# Patient Record
Sex: Female | Born: 1970 | Hispanic: No | Smoking: Light tobacco smoker
Health system: Southern US, Community
[De-identification: ages and names within clinical notes are randomized; demographics above are authoritative.]

## PROBLEM LIST (undated history)

## (undated) DIAGNOSIS — C801 Malignant (primary) neoplasm, unspecified: Secondary | ICD-10-CM

## (undated) DIAGNOSIS — D649 Anemia, unspecified: Secondary | ICD-10-CM

## (undated) HISTORY — DX: Anemia, unspecified: D64.9

## (undated) HISTORY — PX: APPENDECTOMY: SHX54

## (undated) HISTORY — PX: CHOLECYSTECTOMY: SHX55

## (undated) HISTORY — PX: ABDOMINAL HYSTERECTOMY: SHX81

## (undated) HISTORY — DX: Malignant (primary) neoplasm, unspecified: C80.1

---

## 2006-02-25 ENCOUNTER — Other Ambulatory Visit: Admission: RE | Admit: 2006-02-25 | Discharge: 2006-02-25 | Payer: Self-pay | Admitting: Family Medicine

## 2008-09-28 ENCOUNTER — Other Ambulatory Visit: Admission: RE | Admit: 2008-09-28 | Discharge: 2008-09-28 | Payer: Self-pay | Admitting: Family Medicine

## 2015-08-11 ENCOUNTER — Ambulatory Visit (INDEPENDENT_AMBULATORY_CARE_PROVIDER_SITE_OTHER): Payer: 59

## 2015-08-11 ENCOUNTER — Other Ambulatory Visit: Payer: Self-pay | Admitting: Family Medicine

## 2015-08-11 DIAGNOSIS — M7989 Other specified soft tissue disorders: Secondary | ICD-10-CM

## 2015-08-11 DIAGNOSIS — M25552 Pain in left hip: Secondary | ICD-10-CM | POA: Diagnosis not present

## 2015-08-11 MED ORDER — IOPAMIDOL (ISOVUE-300) INJECTION 61%: 100.0000 mL | Freq: Once | INTRAVENOUS | Status: AC | PRN

## 2015-12-11 DIAGNOSIS — Z124 Encounter for screening for malignant neoplasm of cervix: Secondary | ICD-10-CM | POA: Diagnosis not present

## 2015-12-11 DIAGNOSIS — Z13220 Encounter for screening for lipoid disorders: Secondary | ICD-10-CM | POA: Diagnosis not present

## 2015-12-11 DIAGNOSIS — Z0000 Encounter for general adult medical examination without abnormal findings: Secondary | ICD-10-CM | POA: Diagnosis not present

## 2016-01-01 DIAGNOSIS — M79604 Pain in right leg: Secondary | ICD-10-CM | POA: Diagnosis not present

## 2016-01-03 ENCOUNTER — Other Ambulatory Visit: Payer: Self-pay | Admitting: Family Medicine

## 2016-01-03 DIAGNOSIS — M79604 Pain in right leg: Secondary | ICD-10-CM

## 2016-01-03 DIAGNOSIS — M79605 Pain in left leg: Principal | ICD-10-CM

## 2016-01-10 ENCOUNTER — Ambulatory Visit: Payer: BLUE CROSS/BLUE SHIELD

## 2016-01-10 DIAGNOSIS — M79604 Pain in right leg: Secondary | ICD-10-CM

## 2016-01-10 DIAGNOSIS — M79605 Pain in left leg: Principal | ICD-10-CM

## 2016-01-10 DIAGNOSIS — M7989 Other specified soft tissue disorders: Secondary | ICD-10-CM | POA: Diagnosis not present

## 2016-01-24 DIAGNOSIS — I872 Venous insufficiency (chronic) (peripheral): Secondary | ICD-10-CM | POA: Diagnosis not present

## 2016-01-30 DIAGNOSIS — R300 Dysuria: Secondary | ICD-10-CM | POA: Diagnosis not present

## 2016-03-14 DIAGNOSIS — Z1231 Encounter for screening mammogram for malignant neoplasm of breast: Secondary | ICD-10-CM | POA: Diagnosis not present

## 2016-07-19 DIAGNOSIS — F419 Anxiety disorder, unspecified: Secondary | ICD-10-CM | POA: Diagnosis not present

## 2016-07-19 DIAGNOSIS — G44209 Tension-type headache, unspecified, not intractable: Secondary | ICD-10-CM | POA: Diagnosis not present

## 2017-01-14 DIAGNOSIS — Z1329 Encounter for screening for other suspected endocrine disorder: Secondary | ICD-10-CM | POA: Diagnosis not present

## 2017-01-14 DIAGNOSIS — Z136 Encounter for screening for cardiovascular disorders: Secondary | ICD-10-CM | POA: Diagnosis not present

## 2017-01-14 DIAGNOSIS — E559 Vitamin D deficiency, unspecified: Secondary | ICD-10-CM | POA: Diagnosis not present

## 2017-01-14 DIAGNOSIS — Z0000 Encounter for general adult medical examination without abnormal findings: Secondary | ICD-10-CM | POA: Diagnosis not present

## 2017-01-14 DIAGNOSIS — Z131 Encounter for screening for diabetes mellitus: Secondary | ICD-10-CM | POA: Diagnosis not present

## 2017-01-17 ENCOUNTER — Other Ambulatory Visit: Payer: Self-pay

## 2017-01-17 DIAGNOSIS — I872 Venous insufficiency (chronic) (peripheral): Secondary | ICD-10-CM

## 2017-02-14 IMAGING — US US EXTREM LOW VENOUS BILAT
1 series · 14 of 24 positions shown · non-contrast
Comparison: None

CLINICAL DATA: Bilateral lower leg pain and swelling times 6-8
months.

EXAM:
BILATERAL LOWER EXTREMITY VENOUS DOPPLER ULTRASOUND
TECHNIQUE: Gray-scale sonography with compression, as well as color and duplex
ultrasound, were performed to evaluate the deep venous system from
the level of the common femoral vein through the popliteal and
proximal calf veins.

[Series 1: us extrem low venous bilat · 0.06mm/px · 14 of 48 slices shown]
[im 1/48]
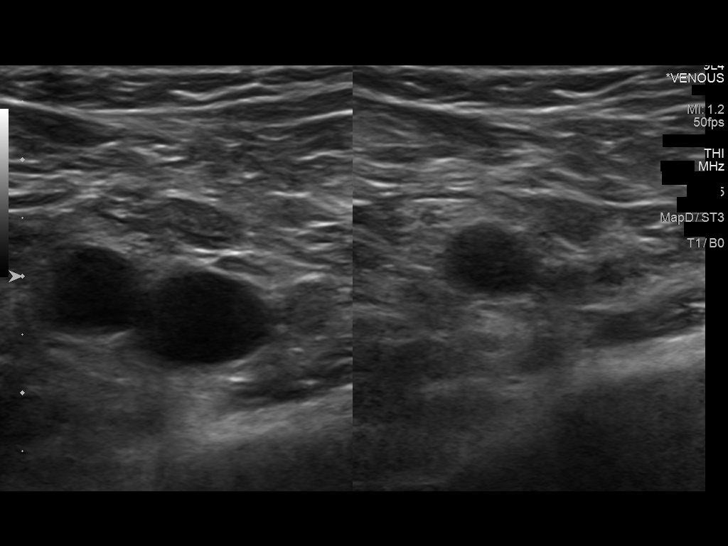
[im 5/48]
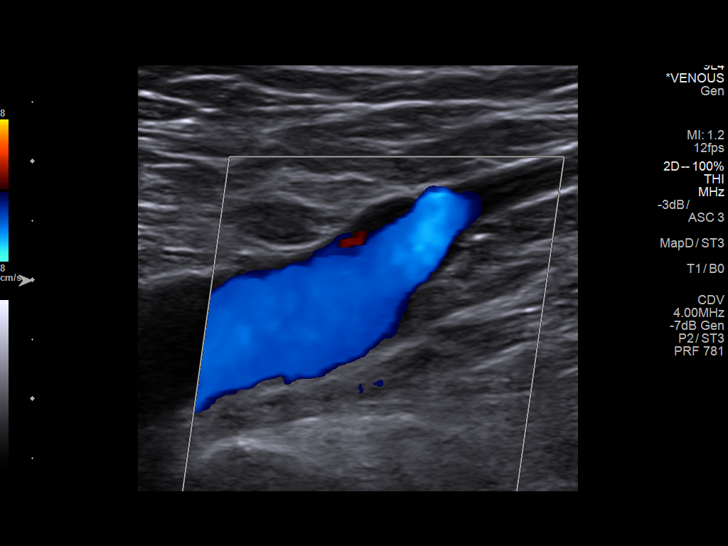
[im 9/48]
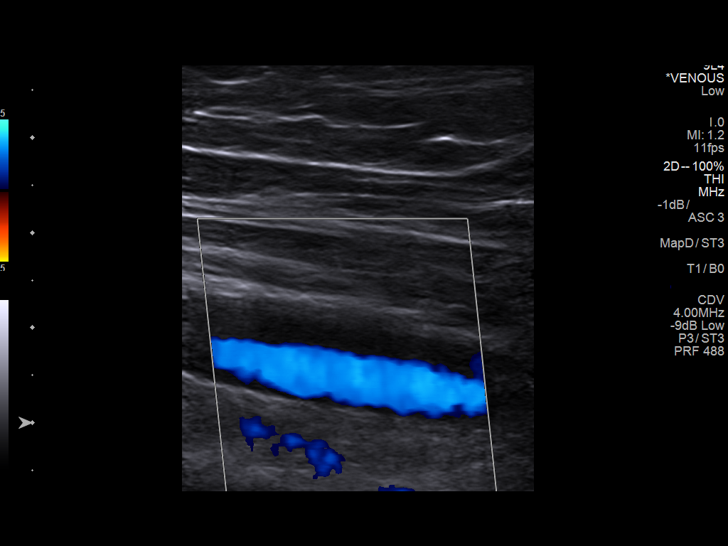
[im 13/48]
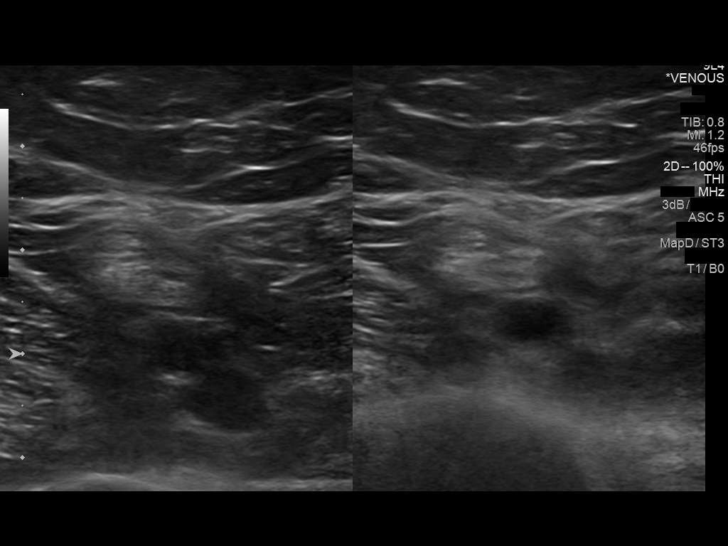
[im 15/48]
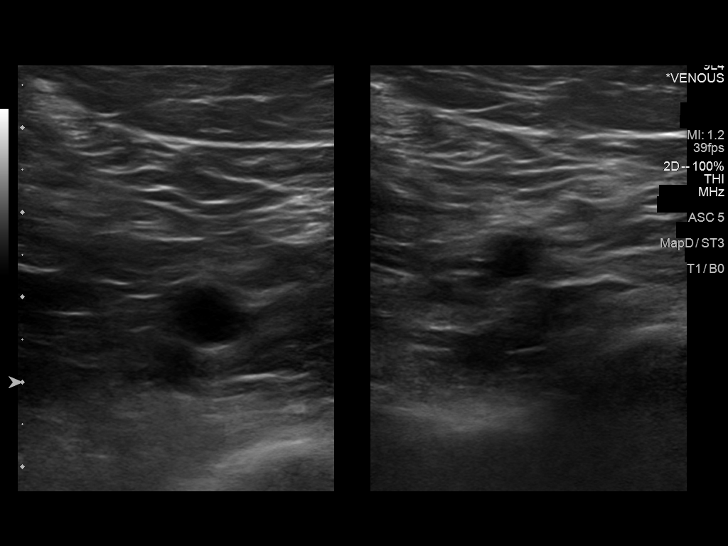
[im 19/48]
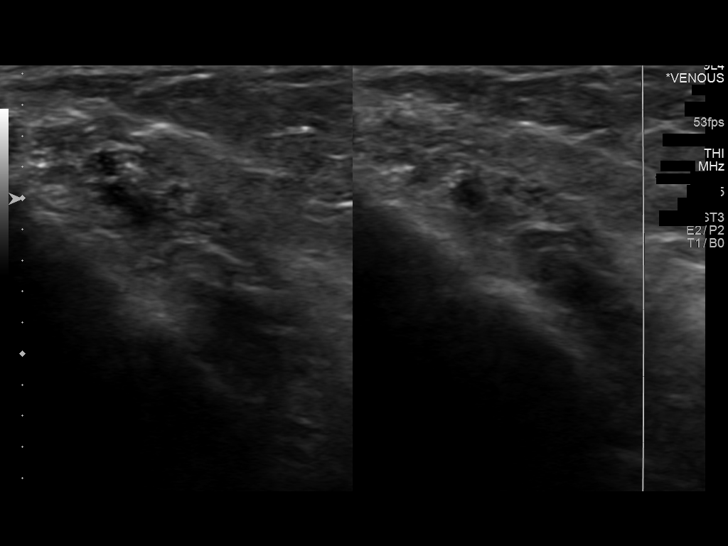
[im 23/48]
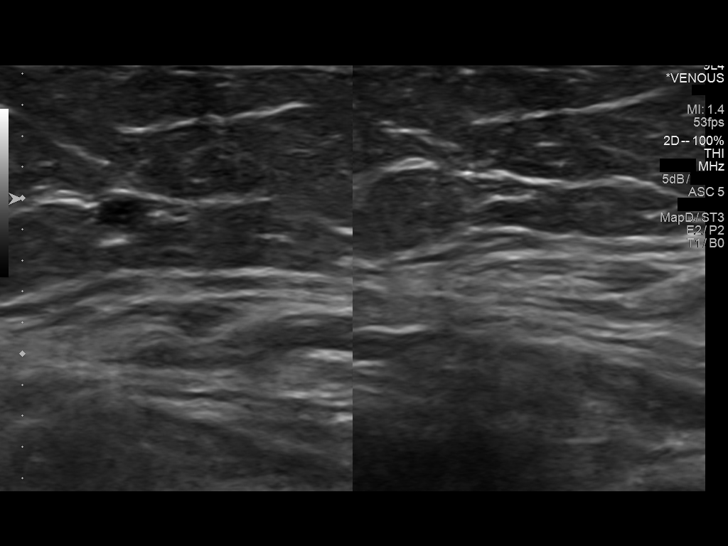
[im 25/48]
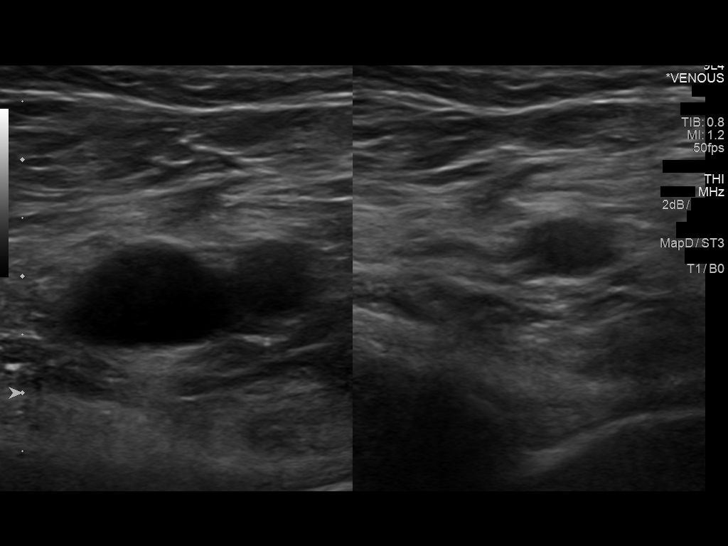
[im 29/48]
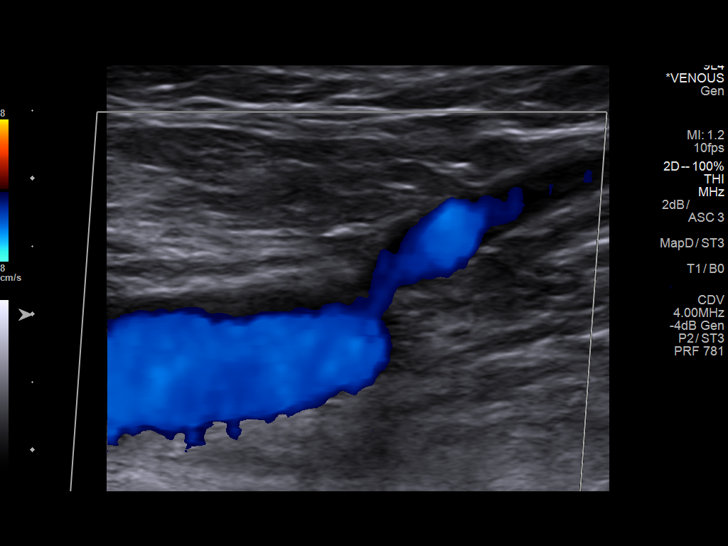
[im 33/48]
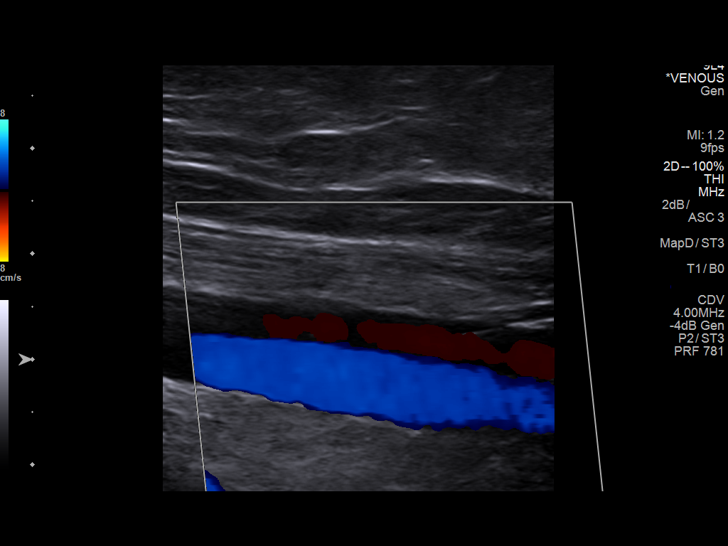
[im 37/48]
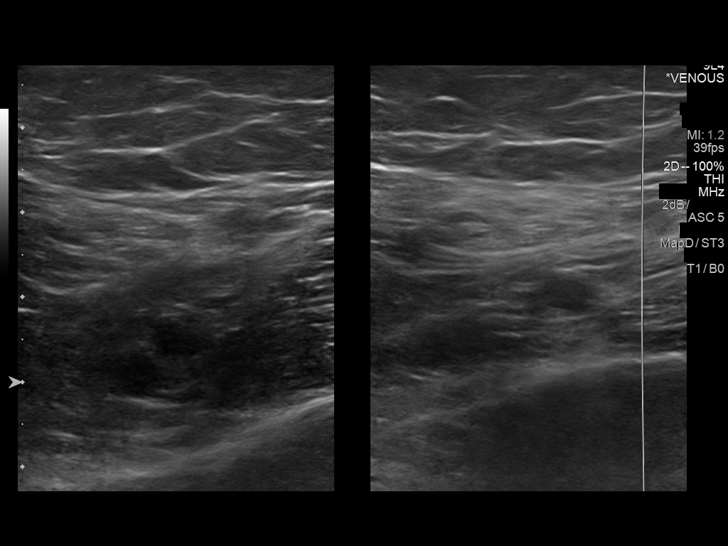
[im 39/48]
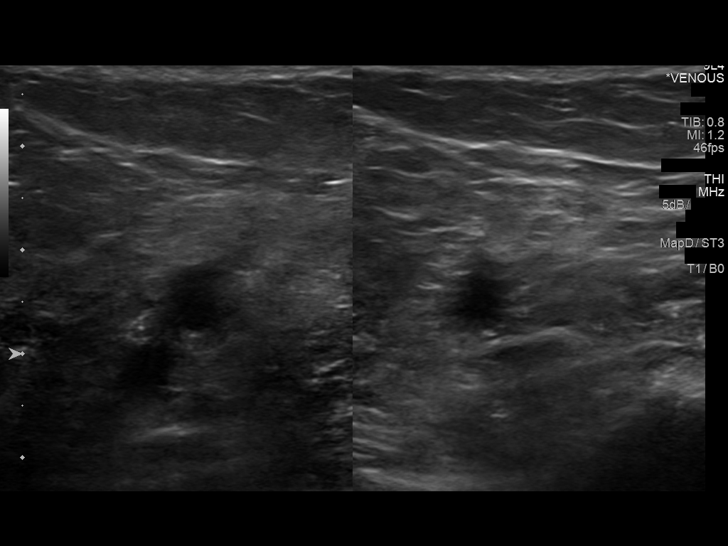
[im 43/48]
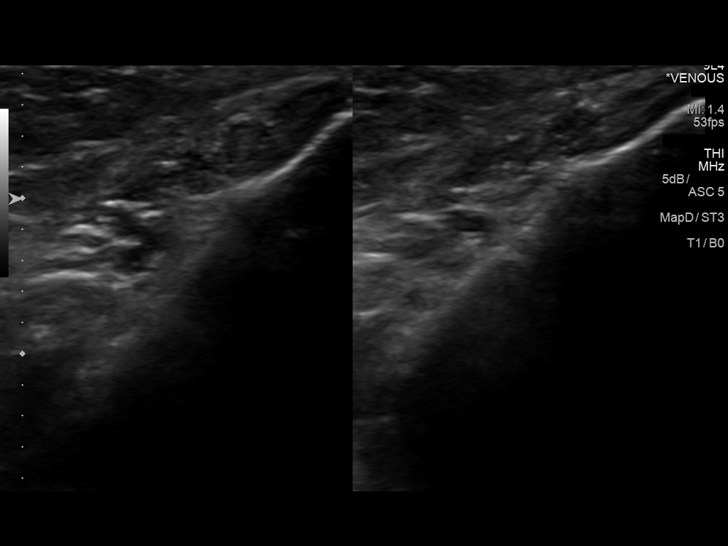
[im 48/48]
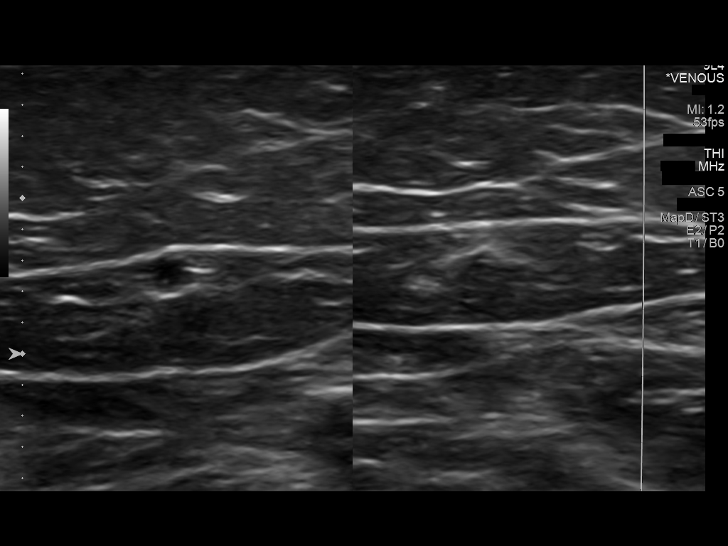

[14 of 24 positions shown; findings below may reference images not displayed]

FINDINGS: Normal compressibility of the common femoral, superficial femoral,
and popliteal veins, as well as the proximal calf veins. No filling
defects to suggest DVT on grayscale or color Doppler imaging.
Doppler waveforms show normal direction of venous flow, normal
respiratory phasicity and response to augmentation. Visualized
segments of the saphenous venous systems normal in caliber and
compressibility.
IMPRESSION: No evidence of  lower extremity deep vein thrombosis, bilaterally.

## 2017-02-25 DIAGNOSIS — Z1231 Encounter for screening mammogram for malignant neoplasm of breast: Secondary | ICD-10-CM | POA: Diagnosis not present

## 2017-03-04 ENCOUNTER — Encounter: Payer: Self-pay | Admitting: Vascular Surgery

## 2017-03-04 ENCOUNTER — Ambulatory Visit (INDEPENDENT_AMBULATORY_CARE_PROVIDER_SITE_OTHER): Payer: BLUE CROSS/BLUE SHIELD | Admitting: Vascular Surgery

## 2017-03-04 ENCOUNTER — Ambulatory Visit (HOSPITAL_COMMUNITY)
Admission: RE | Admit: 2017-03-04 | Discharge: 2017-03-04 | Disposition: A | Discharge status: Home / Self Care | Payer: BLUE CROSS/BLUE SHIELD | Source: Ambulatory Visit | Attending: Vascular Surgery | Admitting: Vascular Surgery

## 2017-03-04 VITALS — BP 119/71 | HR 65 | Temp 97.4°F | Resp 16 | Ht 67.0 in | Wt 138.0 lb

## 2017-03-04 DIAGNOSIS — M7989 Other specified soft tissue disorders: Secondary | ICD-10-CM

## 2017-03-04 DIAGNOSIS — I872 Venous insufficiency (chronic) (peripheral): Secondary | ICD-10-CM | POA: Diagnosis not present

## 2017-03-04 DIAGNOSIS — R600 Localized edema: Secondary | ICD-10-CM | POA: Diagnosis present

## 2017-03-04 NOTE — Progress Notes (Signed)
HISTORY AND PHYSICAL     CC:  Swelling in legs, pain in both legs Requesting Provider:  Starlyn Skeans, PA-C  HPI: This is a 46 y.o. female who presents today with c/o swelling in left leg and pain in her right leg from her knee down.   This has been ongoing for ~1.5 years.  She states that she has a lot of numbness and tingling in both her feet.  She states the swelling is worse after she has been standing for a while.  She denies any back pain.  She has had a venous duplex in the past that was negative for DVT.  She has tried compression stockings, which she said she got no relief.  She states that when she bends down to pick something up off the floor, she has a very difficult time getting back up due to pain.     She does have 2 children.  She works at [employer].  Her maternal grandmother has hx of poor circulation in her legs.    She has a hx of bladder cancer and hx of hysterectomy for endometriosis.   She currently smokes cigarettes.   Past Medical History:  Diagnosis Date  . Anemia   . Cancer [facility])     Past Surgical History:  Procedure Laterality Date  . ABDOMINAL HYSTERECTOMY    . APPENDECTOMY    . CHOLECYSTECTOMY      No Known Allergies  Current Outpatient Prescriptions  Medication Sig Dispense Refill  . mirtazapine (REMERON) 15 MG tablet Take 15 mg by mouth at bedtime.    . Multiple Vitamin (MULTIVITAMIN) tablet Take 1 tablet by mouth daily.     No current facility-administered medications for this visit.    Facility-Administered Medications Ordered in Other Visits  Medication Dose Route Frequency Provider Last Rate Last Dose  . iopamidol (ISOVUE-300) 61 % injection 100 mL  100 mL Intravenous Once PRN Briscoe Deutscher, MD        History reviewed. No pertinent family history.  Social History   Social History  . Marital status: Unknown    Spouse name: N/A  . Number of children: N/A  . Years of education: N/A   Occupational History  . Not on  file.   Social History Main Topics  . Smoking status: Light Tobacco Smoker    Types: Cigarettes  . Smokeless tobacco: Never Used  . Alcohol use Yes  . Drug use: No  . Sexual activity: Not on file   Other Topics Concern  . Not on file   Social History Narrative  . No narrative on file     REVIEW OF SYSTEMS:   [X]  denotes positive finding, [ ]  denotes negative finding Cardiac  Comments:  Chest pain or chest pressure:    Shortness of breath upon exertion:    Short of breath when lying flat:    Irregular heart rhythm:        Vascular    Pain in calf, thigh, or hip brought on by ambulation: x   Pain in feet at night that wakes you up from your sleep:  x   Blood clot in your veins:    Leg swelling:  x       Pulmonary    Oxygen at home:    Productive cough:     Wheezing:         Neurologic    Sudden weakness in arms or legs:     Sudden numbness in arms  or legs:     Sudden onset of difficulty speaking or slurred speech:    Temporary loss of vision in one eye:     Problems with dizziness:         Gastrointestinal    Blood in stool:     Vomited blood:         Genitourinary    Burning when urinating:     Blood in urine:        Psychiatric    Major depression:         Hematologic    Bleeding problems:    Problems with blood clotting too easily:        Skin    Rashes or ulcers:        Constitutional    Fever or chills:      PHYSICAL EXAMINATION:  Vitals:   03/04/17 1247  BP: 119/71  Pulse: 65  Resp: 16  Temp: (!) 97.4 F (36.3 C)  SpO2: 99%   Vitals:   03/04/17 1247  Weight: 138 lb (62.6 kg)  Height: 5\' 7"  (1.702 m)   Body mass index is 21.61 kg/m.  General:  WDWN in NAD; vital signs documented above Gait: Not observed HENT: WNL, normocephalic Pulmonary: normal non-labored breathing , without Rales, rhonchi,  wheezing Cardiac: regular HR, without  Murmurs without carotid bruits Abdomen: soft, NT, no masses Skin: without rashes Vascular  Exam/Pulses:  Right Left  Radial 2+ (normal) 2+ (normal)  Femoral 2+ (normal) 2+ (normal)  Popliteal 2+ (normal) 2+ (normal)  DP 2+ (normal) 2+ (normal)  PT 2+ (normal) 2+ (normal)   Extremities: without ischemic changes, without Gangrene , without cellulitis; without open wounds; mild swelling in the left ankle Musculoskeletal: no muscle wasting or atrophy  Neurologic: A&O X 3;  No focal weakness or paresthesias are detected Psychiatric:  The pt has flat affect.   Non-Invasive Vascular Imaging:   Lower extremity venous duplex 03/04/17: 1.  No evidence of DVT bilaterally 2.  No reflux in the right common femoral vein, femoral vein or popliteal vein 3.  Reflux in the right GSV mid thigh area only 4.  Reflux in the left femoral vein 5.  Reflux in the left saphenofemoral junction with no reflux visualized on distally 6.  No reflux in the SSV bilaterally  Pt meds includes: Statin:  No. Beta Blocker:  No. Aspirin:  No. ACEI:  No. ARB:  No. CCB use:  No Other Antiplatelet/Anticoagulant:  No   ASSESSMENT/PLAN:: 46 y.o. female with swelling and pain in legs   -pt venous duplex today reveals only reflux in the left saphenofemoral junction and none distally as well as in the right GSV mid thigh only, which is not enough to be causing her sx.  Her duplex was also negative for DVT. Her arterial flow is also normal. 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   -she can follow up with [place] as needed.    Leontine Locket, PA-C [facility] [phone]  Clinic MD:  Pt seen and examined with Dr. Donnetta Hutching  I have examined the patient, reviewed and agree with above.  Unclear as to the etiology of her lower extremity symptoms.  The evidence of arterial or venous pathology to explain this.  Reviewed this in detail with the patient.  She is frustrated in not having a diagnosis  for her discomfort.  She will  follow-up with her medical doctor and see [place] on an as-needed basis  Curt Jews, MD 03/04/2017 1:27 PM

## 2017-03-10 ENCOUNTER — Encounter: Payer: 59 | Admitting: Surgery

## 2017-03-10 ENCOUNTER — Encounter (HOSPITAL_COMMUNITY): Payer: 59

## 2017-07-28 DIAGNOSIS — F419 Anxiety disorder, unspecified: Secondary | ICD-10-CM | POA: Diagnosis not present

## 2017-08-21 DIAGNOSIS — M7741 Metatarsalgia, right foot: Secondary | ICD-10-CM | POA: Diagnosis not present

## 2017-09-01 DIAGNOSIS — G5761 Lesion of plantar nerve, right lower limb: Secondary | ICD-10-CM | POA: Diagnosis not present

## 2017-09-01 DIAGNOSIS — M65871 Other synovitis and tenosynovitis, right ankle and foot: Secondary | ICD-10-CM | POA: Diagnosis not present

## 2017-09-08 DIAGNOSIS — M65871 Other synovitis and tenosynovitis, right ankle and foot: Secondary | ICD-10-CM | POA: Diagnosis not present

## 2017-09-08 DIAGNOSIS — M65872 Other synovitis and tenosynovitis, left ankle and foot: Secondary | ICD-10-CM | POA: Diagnosis not present

## 2018-02-13 DIAGNOSIS — M67431 Ganglion, right wrist: Secondary | ICD-10-CM | POA: Diagnosis not present

## 2018-02-13 DIAGNOSIS — M67839 Other specified disorders of synovium and tendon, unspecified forearm: Secondary | ICD-10-CM | POA: Diagnosis not present

## 2018-05-22 DIAGNOSIS — J00 Acute nasopharyngitis [common cold]: Secondary | ICD-10-CM | POA: Diagnosis not present

## 2018-05-25 DIAGNOSIS — H6981 Other specified disorders of Eustachian tube, right ear: Secondary | ICD-10-CM | POA: Diagnosis not present

## 2018-05-25 DIAGNOSIS — J069 Acute upper respiratory infection, unspecified: Secondary | ICD-10-CM | POA: Diagnosis not present

## 2018-08-18 DIAGNOSIS — F419 Anxiety disorder, unspecified: Secondary | ICD-10-CM | POA: Diagnosis not present

## 2018-08-18 DIAGNOSIS — M62511 Muscle wasting and atrophy, not elsewhere classified, right shoulder: Secondary | ICD-10-CM | POA: Diagnosis not present

## 2018-09-07 DIAGNOSIS — M25511 Pain in right shoulder: Secondary | ICD-10-CM | POA: Diagnosis not present

## 2018-09-11 DIAGNOSIS — M25511 Pain in right shoulder: Secondary | ICD-10-CM | POA: Diagnosis not present

## 2018-09-21 DIAGNOSIS — M6740 Ganglion, unspecified site: Secondary | ICD-10-CM | POA: Diagnosis not present

## 2018-09-21 DIAGNOSIS — M25511 Pain in right shoulder: Secondary | ICD-10-CM | POA: Diagnosis not present

## 2018-10-14 DIAGNOSIS — Z20828 Contact with and (suspected) exposure to other viral communicable diseases: Secondary | ICD-10-CM | POA: Diagnosis not present

## 2019-08-04 DIAGNOSIS — M545 Low back pain: Secondary | ICD-10-CM | POA: Diagnosis not present

## 2019-08-04 DIAGNOSIS — R12 Heartburn: Secondary | ICD-10-CM | POA: Diagnosis not present

## 2019-08-04 DIAGNOSIS — R1031 Right lower quadrant pain: Secondary | ICD-10-CM | POA: Diagnosis not present

## 2019-08-05 DIAGNOSIS — K219 Gastro-esophageal reflux disease without esophagitis: Secondary | ICD-10-CM | POA: Diagnosis not present

## 2019-08-05 DIAGNOSIS — R1031 Right lower quadrant pain: Secondary | ICD-10-CM | POA: Diagnosis not present

## 2019-08-05 DIAGNOSIS — C679 Malignant neoplasm of bladder, unspecified: Secondary | ICD-10-CM | POA: Diagnosis not present

## 2019-08-05 DIAGNOSIS — K5909 Other constipation: Secondary | ICD-10-CM | POA: Diagnosis not present

## 2019-08-06 DIAGNOSIS — R1031 Right lower quadrant pain: Secondary | ICD-10-CM | POA: Diagnosis not present

## 2019-08-06 DIAGNOSIS — C679 Malignant neoplasm of bladder, unspecified: Secondary | ICD-10-CM | POA: Diagnosis not present

## 2019-08-06 DIAGNOSIS — Z8509 Personal history of malignant neoplasm of other digestive organs: Secondary | ICD-10-CM | POA: Diagnosis not present

## 2019-08-19 DIAGNOSIS — K3189 Other diseases of stomach and duodenum: Secondary | ICD-10-CM | POA: Diagnosis not present

## 2019-08-19 DIAGNOSIS — K319 Disease of stomach and duodenum, unspecified: Secondary | ICD-10-CM | POA: Diagnosis not present

## 2019-08-19 DIAGNOSIS — D125 Benign neoplasm of sigmoid colon: Secondary | ICD-10-CM | POA: Diagnosis not present

## 2019-08-19 DIAGNOSIS — Z1211 Encounter for screening for malignant neoplasm of colon: Secondary | ICD-10-CM | POA: Diagnosis not present

## 2019-08-19 DIAGNOSIS — K219 Gastro-esophageal reflux disease without esophagitis: Secondary | ICD-10-CM | POA: Diagnosis not present

## 2019-08-19 DIAGNOSIS — D120 Benign neoplasm of cecum: Secondary | ICD-10-CM | POA: Diagnosis not present

## 2019-08-30 DIAGNOSIS — Z23 Encounter for immunization: Secondary | ICD-10-CM | POA: Diagnosis not present

## 2019-08-30 DIAGNOSIS — R109 Unspecified abdominal pain: Secondary | ICD-10-CM | POA: Diagnosis not present

## 2019-08-30 DIAGNOSIS — Z8551 Personal history of malignant neoplasm of bladder: Secondary | ICD-10-CM | POA: Diagnosis not present

## 2019-09-22 DIAGNOSIS — Z9889 Other specified postprocedural states: Secondary | ICD-10-CM | POA: Diagnosis not present

## 2019-09-22 DIAGNOSIS — K581 Irritable bowel syndrome with constipation: Secondary | ICD-10-CM | POA: Diagnosis not present

## 2019-09-22 DIAGNOSIS — D126 Benign neoplasm of colon, unspecified: Secondary | ICD-10-CM | POA: Diagnosis not present

## 2019-09-27 DIAGNOSIS — Z23 Encounter for immunization: Secondary | ICD-10-CM | POA: Diagnosis not present

## 2019-10-12 DIAGNOSIS — M67472 Ganglion, left ankle and foot: Secondary | ICD-10-CM | POA: Diagnosis not present

## 2019-10-14 DIAGNOSIS — Z8551 Personal history of malignant neoplasm of bladder: Secondary | ICD-10-CM | POA: Diagnosis not present

## 2020-01-25 DIAGNOSIS — Z9889 Other specified postprocedural states: Secondary | ICD-10-CM | POA: Diagnosis not present

## 2020-01-25 DIAGNOSIS — D126 Benign neoplasm of colon, unspecified: Secondary | ICD-10-CM | POA: Diagnosis not present

## 2020-01-25 DIAGNOSIS — K219 Gastro-esophageal reflux disease without esophagitis: Secondary | ICD-10-CM | POA: Diagnosis not present

## 2020-01-25 DIAGNOSIS — R1013 Epigastric pain: Secondary | ICD-10-CM | POA: Diagnosis not present

## 2020-01-25 DIAGNOSIS — K581 Irritable bowel syndrome with constipation: Secondary | ICD-10-CM | POA: Diagnosis not present

## 2020-01-31 DIAGNOSIS — E538 Deficiency of other specified B group vitamins: Secondary | ICD-10-CM | POA: Diagnosis not present

## 2020-02-15 DIAGNOSIS — E538 Deficiency of other specified B group vitamins: Secondary | ICD-10-CM | POA: Diagnosis not present

## 2020-02-21 DIAGNOSIS — Z131 Encounter for screening for diabetes mellitus: Secondary | ICD-10-CM | POA: Diagnosis not present

## 2020-02-21 DIAGNOSIS — Z0000 Encounter for general adult medical examination without abnormal findings: Secondary | ICD-10-CM | POA: Diagnosis not present

## 2020-02-29 DIAGNOSIS — E538 Deficiency of other specified B group vitamins: Secondary | ICD-10-CM | POA: Diagnosis not present

## 2020-03-02 DIAGNOSIS — Z1231 Encounter for screening mammogram for malignant neoplasm of breast: Secondary | ICD-10-CM | POA: Diagnosis not present

## 2020-03-16 DIAGNOSIS — E538 Deficiency of other specified B group vitamins: Secondary | ICD-10-CM | POA: Diagnosis not present

## 2020-07-01 DIAGNOSIS — R079 Chest pain, unspecified: Secondary | ICD-10-CM | POA: Diagnosis not present

## 2020-07-01 DIAGNOSIS — B349 Viral infection, unspecified: Secondary | ICD-10-CM | POA: Diagnosis not present

## 2020-07-01 DIAGNOSIS — R059 Cough, unspecified: Secondary | ICD-10-CM | POA: Diagnosis not present

## 2020-07-01 DIAGNOSIS — Z20822 Contact with and (suspected) exposure to covid-19: Secondary | ICD-10-CM | POA: Diagnosis not present

## 2020-07-01 DIAGNOSIS — J479 Bronchiectasis, uncomplicated: Secondary | ICD-10-CM | POA: Diagnosis not present

## 2020-07-01 DIAGNOSIS — R0602 Shortness of breath: Secondary | ICD-10-CM | POA: Diagnosis not present

## 2020-07-01 DIAGNOSIS — R0789 Other chest pain: Secondary | ICD-10-CM | POA: Diagnosis not present

## 2020-07-01 DIAGNOSIS — R072 Precordial pain: Secondary | ICD-10-CM | POA: Diagnosis not present

## 2020-07-01 DIAGNOSIS — F17200 Nicotine dependence, unspecified, uncomplicated: Secondary | ICD-10-CM | POA: Diagnosis not present

## 2020-07-01 DIAGNOSIS — R9431 Abnormal electrocardiogram [ECG] [EKG]: Secondary | ICD-10-CM | POA: Diagnosis not present

## 2020-07-01 DIAGNOSIS — R42 Dizziness and giddiness: Secondary | ICD-10-CM | POA: Diagnosis not present

## 2020-07-01 DIAGNOSIS — I10 Essential (primary) hypertension: Secondary | ICD-10-CM | POA: Diagnosis not present

## 2020-07-04 DIAGNOSIS — U071 COVID-19: Secondary | ICD-10-CM | POA: Diagnosis not present

## 2020-07-09 DIAGNOSIS — I498 Other specified cardiac arrhythmias: Secondary | ICD-10-CM | POA: Diagnosis not present

## 2020-07-09 DIAGNOSIS — R002 Palpitations: Secondary | ICD-10-CM | POA: Diagnosis not present

## 2020-07-09 DIAGNOSIS — R059 Cough, unspecified: Secondary | ICD-10-CM | POA: Diagnosis not present

## 2020-07-09 DIAGNOSIS — R202 Paresthesia of skin: Secondary | ICD-10-CM | POA: Diagnosis not present

## 2020-07-09 DIAGNOSIS — Z79899 Other long term (current) drug therapy: Secondary | ICD-10-CM | POA: Diagnosis not present

## 2020-07-09 DIAGNOSIS — R0789 Other chest pain: Secondary | ICD-10-CM | POA: Diagnosis not present

## 2020-07-09 DIAGNOSIS — F419 Anxiety disorder, unspecified: Secondary | ICD-10-CM | POA: Diagnosis not present

## 2020-07-09 DIAGNOSIS — U099 Post covid-19 condition, unspecified: Secondary | ICD-10-CM | POA: Diagnosis not present

## 2020-07-09 DIAGNOSIS — R0602 Shortness of breath: Secondary | ICD-10-CM | POA: Diagnosis not present

## 2020-07-09 DIAGNOSIS — R0600 Dyspnea, unspecified: Secondary | ICD-10-CM | POA: Diagnosis not present

## 2020-07-09 DIAGNOSIS — R5383 Other fatigue: Secondary | ICD-10-CM | POA: Diagnosis not present

## 2020-07-09 DIAGNOSIS — F17200 Nicotine dependence, unspecified, uncomplicated: Secondary | ICD-10-CM | POA: Diagnosis not present

## 2020-07-10 DIAGNOSIS — I498 Other specified cardiac arrhythmias: Secondary | ICD-10-CM | POA: Diagnosis not present

## 2020-12-14 DIAGNOSIS — Z8551 Personal history of malignant neoplasm of bladder: Secondary | ICD-10-CM | POA: Diagnosis not present

## 2020-12-14 DIAGNOSIS — C679 Malignant neoplasm of bladder, unspecified: Secondary | ICD-10-CM | POA: Diagnosis not present

## 2021-01-09 DIAGNOSIS — N898 Other specified noninflammatory disorders of vagina: Secondary | ICD-10-CM | POA: Diagnosis not present

## 2021-01-09 DIAGNOSIS — Z7251 High risk heterosexual behavior: Secondary | ICD-10-CM | POA: Diagnosis not present

## 2021-12-19 DIAGNOSIS — C679 Malignant neoplasm of bladder, unspecified: Secondary | ICD-10-CM | POA: Diagnosis not present

## 2021-12-19 DIAGNOSIS — Z8551 Personal history of malignant neoplasm of bladder: Secondary | ICD-10-CM | POA: Diagnosis not present

## 2022-03-11 DIAGNOSIS — Z1231 Encounter for screening mammogram for malignant neoplasm of breast: Secondary | ICD-10-CM | POA: Diagnosis not present

## 2022-09-02 DIAGNOSIS — Z0000 Encounter for general adult medical examination without abnormal findings: Secondary | ICD-10-CM | POA: Diagnosis not present

## 2022-09-02 DIAGNOSIS — G629 Polyneuropathy, unspecified: Secondary | ICD-10-CM | POA: Diagnosis not present

## 2022-09-02 DIAGNOSIS — Z13220 Encounter for screening for lipoid disorders: Secondary | ICD-10-CM | POA: Diagnosis not present

## 2022-09-02 DIAGNOSIS — Z23 Encounter for immunization: Secondary | ICD-10-CM | POA: Diagnosis not present

## 2022-09-03 ENCOUNTER — Other Ambulatory Visit: Payer: Self-pay | Admitting: Family Medicine

## 2022-09-03 DIAGNOSIS — F17210 Nicotine dependence, cigarettes, uncomplicated: Secondary | ICD-10-CM

## 2022-09-03 DIAGNOSIS — R109 Unspecified abdominal pain: Secondary | ICD-10-CM

## 2022-09-06 ENCOUNTER — Ambulatory Visit (INDEPENDENT_AMBULATORY_CARE_PROVIDER_SITE_OTHER): Payer: BC Managed Care – PPO

## 2022-09-06 DIAGNOSIS — R109 Unspecified abdominal pain: Secondary | ICD-10-CM

## 2022-09-06 DIAGNOSIS — R1030 Lower abdominal pain, unspecified: Secondary | ICD-10-CM

## 2022-09-06 DIAGNOSIS — F17210 Nicotine dependence, cigarettes, uncomplicated: Secondary | ICD-10-CM

## 2022-09-25 DIAGNOSIS — G629 Polyneuropathy, unspecified: Secondary | ICD-10-CM | POA: Diagnosis not present

## 2022-10-15 DIAGNOSIS — Z09 Encounter for follow-up examination after completed treatment for conditions other than malignant neoplasm: Secondary | ICD-10-CM | POA: Diagnosis not present

## 2022-10-15 DIAGNOSIS — D120 Benign neoplasm of cecum: Secondary | ICD-10-CM | POA: Diagnosis not present

## 2022-10-15 DIAGNOSIS — K635 Polyp of colon: Secondary | ICD-10-CM | POA: Diagnosis not present

## 2022-10-15 DIAGNOSIS — Z86010 Personal history of colonic polyps: Secondary | ICD-10-CM | POA: Diagnosis not present

## 2022-10-15 DIAGNOSIS — D123 Benign neoplasm of transverse colon: Secondary | ICD-10-CM | POA: Diagnosis not present

## 2022-11-04 DIAGNOSIS — E7800 Pure hypercholesterolemia, unspecified: Secondary | ICD-10-CM | POA: Diagnosis not present

## 2022-11-04 DIAGNOSIS — Z79899 Other long term (current) drug therapy: Secondary | ICD-10-CM | POA: Diagnosis not present

## 2022-11-06 DIAGNOSIS — R1030 Lower abdominal pain, unspecified: Secondary | ICD-10-CM | POA: Diagnosis not present

## 2022-11-06 DIAGNOSIS — K581 Irritable bowel syndrome with constipation: Secondary | ICD-10-CM | POA: Diagnosis not present

## 2022-11-26 DIAGNOSIS — R531 Weakness: Secondary | ICD-10-CM | POA: Diagnosis not present

## 2022-11-27 DIAGNOSIS — M25511 Pain in right shoulder: Secondary | ICD-10-CM | POA: Diagnosis not present

## 2022-11-27 DIAGNOSIS — R200 Anesthesia of skin: Secondary | ICD-10-CM | POA: Diagnosis not present

## 2022-11-27 DIAGNOSIS — M6281 Muscle weakness (generalized): Secondary | ICD-10-CM | POA: Diagnosis not present

## 2022-11-27 DIAGNOSIS — M542 Cervicalgia: Secondary | ICD-10-CM | POA: Diagnosis not present

## 2022-12-04 DIAGNOSIS — M25511 Pain in right shoulder: Secondary | ICD-10-CM | POA: Diagnosis not present

## 2022-12-04 DIAGNOSIS — R200 Anesthesia of skin: Secondary | ICD-10-CM | POA: Diagnosis not present

## 2022-12-04 DIAGNOSIS — M542 Cervicalgia: Secondary | ICD-10-CM | POA: Diagnosis not present

## 2022-12-04 DIAGNOSIS — M6281 Muscle weakness (generalized): Secondary | ICD-10-CM | POA: Diagnosis not present

## 2022-12-04 DIAGNOSIS — E538 Deficiency of other specified B group vitamins: Secondary | ICD-10-CM | POA: Diagnosis not present

## 2022-12-11 DIAGNOSIS — E538 Deficiency of other specified B group vitamins: Secondary | ICD-10-CM | POA: Diagnosis not present

## 2022-12-11 DIAGNOSIS — R200 Anesthesia of skin: Secondary | ICD-10-CM | POA: Diagnosis not present

## 2022-12-11 DIAGNOSIS — M6281 Muscle weakness (generalized): Secondary | ICD-10-CM | POA: Diagnosis not present

## 2022-12-11 DIAGNOSIS — M542 Cervicalgia: Secondary | ICD-10-CM | POA: Diagnosis not present

## 2022-12-11 DIAGNOSIS — M25511 Pain in right shoulder: Secondary | ICD-10-CM | POA: Diagnosis not present

## 2022-12-17 DIAGNOSIS — M6281 Muscle weakness (generalized): Secondary | ICD-10-CM | POA: Diagnosis not present

## 2022-12-17 DIAGNOSIS — R200 Anesthesia of skin: Secondary | ICD-10-CM | POA: Diagnosis not present

## 2022-12-17 DIAGNOSIS — M542 Cervicalgia: Secondary | ICD-10-CM | POA: Diagnosis not present

## 2022-12-17 DIAGNOSIS — M25511 Pain in right shoulder: Secondary | ICD-10-CM | POA: Diagnosis not present

## 2022-12-17 DIAGNOSIS — E538 Deficiency of other specified B group vitamins: Secondary | ICD-10-CM | POA: Diagnosis not present

## 2022-12-20 DIAGNOSIS — M25511 Pain in right shoulder: Secondary | ICD-10-CM | POA: Diagnosis not present

## 2022-12-20 DIAGNOSIS — M542 Cervicalgia: Secondary | ICD-10-CM | POA: Diagnosis not present

## 2022-12-20 DIAGNOSIS — R200 Anesthesia of skin: Secondary | ICD-10-CM | POA: Diagnosis not present

## 2022-12-20 DIAGNOSIS — M6281 Muscle weakness (generalized): Secondary | ICD-10-CM | POA: Diagnosis not present

## 2022-12-24 DIAGNOSIS — R200 Anesthesia of skin: Secondary | ICD-10-CM | POA: Diagnosis not present

## 2022-12-24 DIAGNOSIS — M6281 Muscle weakness (generalized): Secondary | ICD-10-CM | POA: Diagnosis not present

## 2022-12-24 DIAGNOSIS — E538 Deficiency of other specified B group vitamins: Secondary | ICD-10-CM | POA: Diagnosis not present

## 2022-12-24 DIAGNOSIS — M25511 Pain in right shoulder: Secondary | ICD-10-CM | POA: Diagnosis not present

## 2022-12-24 DIAGNOSIS — M542 Cervicalgia: Secondary | ICD-10-CM | POA: Diagnosis not present

## 2022-12-31 DIAGNOSIS — M542 Cervicalgia: Secondary | ICD-10-CM | POA: Diagnosis not present

## 2022-12-31 DIAGNOSIS — M6281 Muscle weakness (generalized): Secondary | ICD-10-CM | POA: Diagnosis not present

## 2022-12-31 DIAGNOSIS — R200 Anesthesia of skin: Secondary | ICD-10-CM | POA: Diagnosis not present

## 2022-12-31 DIAGNOSIS — M25511 Pain in right shoulder: Secondary | ICD-10-CM | POA: Diagnosis not present

## 2023-01-03 DIAGNOSIS — M6281 Muscle weakness (generalized): Secondary | ICD-10-CM | POA: Diagnosis not present

## 2023-01-03 DIAGNOSIS — M542 Cervicalgia: Secondary | ICD-10-CM | POA: Diagnosis not present

## 2023-01-03 DIAGNOSIS — M25511 Pain in right shoulder: Secondary | ICD-10-CM | POA: Diagnosis not present

## 2023-01-03 DIAGNOSIS — R200 Anesthesia of skin: Secondary | ICD-10-CM | POA: Diagnosis not present

## 2023-01-12 DIAGNOSIS — U071 COVID-19: Secondary | ICD-10-CM | POA: Diagnosis not present

## 2023-01-30 DIAGNOSIS — R3129 Other microscopic hematuria: Secondary | ICD-10-CM | POA: Diagnosis not present

## 2023-01-30 DIAGNOSIS — C678 Malignant neoplasm of overlapping sites of bladder: Secondary | ICD-10-CM | POA: Diagnosis not present

## 2023-01-30 DIAGNOSIS — R8271 Bacteriuria: Secondary | ICD-10-CM | POA: Diagnosis not present

## 2023-02-05 DIAGNOSIS — M79604 Pain in right leg: Secondary | ICD-10-CM | POA: Diagnosis not present

## 2023-02-05 DIAGNOSIS — Z1330 Encounter for screening examination for mental health and behavioral disorders, unspecified: Secondary | ICD-10-CM | POA: Diagnosis not present

## 2023-02-05 DIAGNOSIS — M79605 Pain in left leg: Secondary | ICD-10-CM | POA: Diagnosis not present

## 2023-02-07 DIAGNOSIS — H6692 Otitis media, unspecified, left ear: Secondary | ICD-10-CM | POA: Diagnosis not present

## 2023-02-07 DIAGNOSIS — H938X3 Other specified disorders of ear, bilateral: Secondary | ICD-10-CM | POA: Diagnosis not present

## 2023-02-11 DIAGNOSIS — H6692 Otitis media, unspecified, left ear: Secondary | ICD-10-CM | POA: Diagnosis not present

## 2023-02-11 DIAGNOSIS — B9689 Other specified bacterial agents as the cause of diseases classified elsewhere: Secondary | ICD-10-CM | POA: Diagnosis not present

## 2023-02-13 DIAGNOSIS — E538 Deficiency of other specified B group vitamins: Secondary | ICD-10-CM | POA: Diagnosis not present

## 2023-02-26 DIAGNOSIS — Z23 Encounter for immunization: Secondary | ICD-10-CM | POA: Diagnosis not present

## 2023-03-18 DIAGNOSIS — E538 Deficiency of other specified B group vitamins: Secondary | ICD-10-CM | POA: Diagnosis not present

## 2023-03-25 DIAGNOSIS — H906 Mixed conductive and sensorineural hearing loss, bilateral: Secondary | ICD-10-CM | POA: Diagnosis not present

## 2023-03-25 DIAGNOSIS — H6993 Unspecified Eustachian tube disorder, bilateral: Secondary | ICD-10-CM | POA: Diagnosis not present

## 2023-05-23 DIAGNOSIS — J189 Pneumonia, unspecified organism: Secondary | ICD-10-CM | POA: Diagnosis not present

## 2023-06-12 DIAGNOSIS — H6993 Unspecified Eustachian tube disorder, bilateral: Secondary | ICD-10-CM | POA: Diagnosis not present

## 2023-07-31 DIAGNOSIS — F419 Anxiety disorder, unspecified: Secondary | ICD-10-CM | POA: Diagnosis not present

## 2023-07-31 DIAGNOSIS — F32A Depression, unspecified: Secondary | ICD-10-CM | POA: Diagnosis not present

## 2023-08-05 DIAGNOSIS — F17210 Nicotine dependence, cigarettes, uncomplicated: Secondary | ICD-10-CM | POA: Diagnosis not present

## 2023-08-05 DIAGNOSIS — R110 Nausea: Secondary | ICD-10-CM | POA: Diagnosis not present

## 2023-08-05 DIAGNOSIS — F419 Anxiety disorder, unspecified: Secondary | ICD-10-CM | POA: Diagnosis not present

## 2023-08-05 DIAGNOSIS — Z79899 Other long term (current) drug therapy: Secondary | ICD-10-CM | POA: Diagnosis not present

## 2023-08-05 DIAGNOSIS — R0789 Other chest pain: Secondary | ICD-10-CM | POA: Diagnosis not present

## 2023-08-05 DIAGNOSIS — R079 Chest pain, unspecified: Secondary | ICD-10-CM | POA: Diagnosis not present

## 2023-08-05 DIAGNOSIS — Z8551 Personal history of malignant neoplasm of bladder: Secondary | ICD-10-CM | POA: Diagnosis not present

## 2023-08-07 DIAGNOSIS — R079 Chest pain, unspecified: Secondary | ICD-10-CM | POA: Diagnosis not present

## 2023-08-07 DIAGNOSIS — Z1330 Encounter for screening examination for mental health and behavioral disorders, unspecified: Secondary | ICD-10-CM | POA: Diagnosis not present

## 2023-08-11 DIAGNOSIS — R079 Chest pain, unspecified: Secondary | ICD-10-CM | POA: Diagnosis not present

## 2023-08-14 DIAGNOSIS — F419 Anxiety disorder, unspecified: Secondary | ICD-10-CM | POA: Diagnosis not present

## 2023-08-14 DIAGNOSIS — M5412 Radiculopathy, cervical region: Secondary | ICD-10-CM | POA: Diagnosis not present

## 2023-08-14 DIAGNOSIS — K30 Functional dyspepsia: Secondary | ICD-10-CM | POA: Diagnosis not present

## 2023-08-20 DIAGNOSIS — Z1231 Encounter for screening mammogram for malignant neoplasm of breast: Secondary | ICD-10-CM | POA: Diagnosis not present

## 2023-08-20 DIAGNOSIS — R92333 Mammographic heterogeneous density, bilateral breasts: Secondary | ICD-10-CM | POA: Diagnosis not present

## 2023-09-06 DIAGNOSIS — M542 Cervicalgia: Secondary | ICD-10-CM | POA: Diagnosis not present

## 2023-10-01 DIAGNOSIS — F419 Anxiety disorder, unspecified: Secondary | ICD-10-CM | POA: Diagnosis not present

## 2023-10-01 DIAGNOSIS — Z6824 Body mass index (BMI) 24.0-24.9, adult: Secondary | ICD-10-CM | POA: Diagnosis not present

## 2023-10-01 DIAGNOSIS — M5412 Radiculopathy, cervical region: Secondary | ICD-10-CM | POA: Diagnosis not present

## 2023-10-22 DIAGNOSIS — M5412 Radiculopathy, cervical region: Secondary | ICD-10-CM | POA: Diagnosis not present

## 2023-10-23 DIAGNOSIS — E538 Deficiency of other specified B group vitamins: Secondary | ICD-10-CM | POA: Diagnosis not present

## 2023-10-23 DIAGNOSIS — Z131 Encounter for screening for diabetes mellitus: Secondary | ICD-10-CM | POA: Diagnosis not present

## 2023-10-23 DIAGNOSIS — M5412 Radiculopathy, cervical region: Secondary | ICD-10-CM | POA: Diagnosis not present

## 2023-10-23 DIAGNOSIS — Z13220 Encounter for screening for lipoid disorders: Secondary | ICD-10-CM | POA: Diagnosis not present

## 2023-10-23 DIAGNOSIS — F419 Anxiety disorder, unspecified: Secondary | ICD-10-CM | POA: Diagnosis not present

## 2023-10-23 DIAGNOSIS — E559 Vitamin D deficiency, unspecified: Secondary | ICD-10-CM | POA: Diagnosis not present

## 2023-10-23 DIAGNOSIS — Z0000 Encounter for general adult medical examination without abnormal findings: Secondary | ICD-10-CM | POA: Diagnosis not present

## 2023-12-02 DIAGNOSIS — M542 Cervicalgia: Secondary | ICD-10-CM | POA: Diagnosis not present

## 2023-12-17 DIAGNOSIS — M5412 Radiculopathy, cervical region: Secondary | ICD-10-CM | POA: Diagnosis not present

## 2023-12-31 DIAGNOSIS — M5412 Radiculopathy, cervical region: Secondary | ICD-10-CM | POA: Diagnosis not present

## 2024-01-13 DIAGNOSIS — M5412 Radiculopathy, cervical region: Secondary | ICD-10-CM | POA: Diagnosis not present

## 2024-02-10 DIAGNOSIS — M5412 Radiculopathy, cervical region: Secondary | ICD-10-CM | POA: Diagnosis not present

## 2024-02-25 DIAGNOSIS — Z23 Encounter for immunization: Secondary | ICD-10-CM | POA: Diagnosis not present
